# Patient Record
Sex: Male | Born: 1995 | Race: White | Hispanic: No | Marital: Single
Health system: Southern US, Community
[De-identification: ages and names within clinical notes are randomized; demographics above are authoritative.]

## PROBLEM LIST (undated history)

## (undated) HISTORY — PX: SKIN GRAFT: SHX250

---
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
## 2022-09-05 ENCOUNTER — Other Ambulatory Visit: Payer: Self-pay

## 2022-09-05 ENCOUNTER — Emergency Department (HOSPITAL_BASED_OUTPATIENT_CLINIC_OR_DEPARTMENT_OTHER)
Admission: EM | Admit: 2022-09-05 | Discharge: 2022-09-05 | Disposition: A | Discharge status: Home / Self Care | Payer: Self-pay | Attending: Emergency Medicine | Admitting: Emergency Medicine

## 2022-09-05 DIAGNOSIS — E860 Dehydration: Secondary | ICD-10-CM | POA: Insufficient documentation

## 2022-09-05 DIAGNOSIS — K029 Dental caries, unspecified: Secondary | ICD-10-CM | POA: Insufficient documentation

## 2022-09-05 DIAGNOSIS — R110 Nausea: Secondary | ICD-10-CM

## 2022-09-05 MED ORDER — ONDANSETRON HCL 4 MG/2ML IJ SOLN
4.0000 mg | Freq: Once | INTRAMUSCULAR | Status: AC
Start: 2022-09-05 — End: 2022-09-05
  Administered 2022-09-05: 4 mg via INTRAVENOUS
  Filled 2022-09-05: qty 2

## 2022-09-05 MED ORDER — SODIUM CHLORIDE 0.9 % IV BOLUS
1000.0000 mL | Freq: Once | INTRAVENOUS | Status: AC
  Administered 2022-09-05: 1000 mL via INTRAVENOUS

## 2022-09-05 MED ORDER — AMOXICILLIN-POT CLAVULANATE 875-125 MG PO TABS: 1.0000 | ORAL_TABLET | Freq: Two times a day (BID) | ORAL | 0 refills | Status: AC

## 2022-09-05 MED ORDER — ONDANSETRON HCL 4 MG PO TABS: 4.0000 mg | ORAL_TABLET | Freq: Four times a day (QID) | ORAL | 0 refills | Status: AC | PRN

## 2022-09-05 MED ORDER — MORPHINE SULFATE (PF) 4 MG/ML IV SOLN
4.0000 mg | Freq: Once | INTRAVENOUS | Status: AC
  Administered 2022-09-05: 4 mg via INTRAVENOUS
  Filled 2022-09-05: qty 1

## 2022-09-05 NOTE — ED Triage Notes (Addendum)
Patient presents to ED via POV from home. Here with left upper dental pain. Expresses concerns for dehydration. Tachycardiac. Reports he has been unable to have anything by mouth due to the pain.
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
## 2022-09-05 NOTE — Discharge Instructions (Signed)
Please use Tylenol or ibuprofen for pain.  You may use 600 mg ibuprofen every 6 hours or 1000 mg of Tylenol every 6 hours.  You may choose to alternate between the 2.  This would be most effective.  Not to exceed 4 g of Tylenol within 24 hours.  Not to exceed 3200 mg ibuprofen 24 hours.  Please follow-up as scheduled with your dentist, begin taking the antibiotics that prescribed, you can use the nausea medication I have sent you home with as needed to tolerate oral pain medication and antibiotics.
